# Patient Record
Sex: Female | Born: 1990 | Race: White | Hispanic: No | Marital: Single | Smoking: Never smoker
Health system: Southern US, Community
[De-identification: ages and names within clinical notes are randomized; demographics above are authoritative.]

## PROBLEM LIST (undated history)

## (undated) DIAGNOSIS — N912 Amenorrhea, unspecified: Secondary | ICD-10-CM

## (undated) DIAGNOSIS — N200 Calculus of kidney: Secondary | ICD-10-CM

## (undated) HISTORY — DX: Calculus of kidney: N20.0

## (undated) HISTORY — DX: Amenorrhea, unspecified: N91.2

---

## 2013-01-14 ENCOUNTER — Ambulatory Visit (INDEPENDENT_AMBULATORY_CARE_PROVIDER_SITE_OTHER): Payer: BC Managed Care – PPO | Admitting: Gynecology

## 2013-01-14 ENCOUNTER — Encounter: Payer: Self-pay | Admitting: Gynecology

## 2013-01-14 VITALS — BP 116/70 | Ht 61.0 in | Wt 215.0 lb

## 2013-01-14 DIAGNOSIS — F3281 Premenstrual dysphoric disorder: Secondary | ICD-10-CM

## 2013-01-14 DIAGNOSIS — Z01419 Encounter for gynecological examination (general) (routine) without abnormal findings: Secondary | ICD-10-CM

## 2013-01-14 DIAGNOSIS — N943 Premenstrual tension syndrome: Secondary | ICD-10-CM

## 2013-01-14 DIAGNOSIS — N912 Amenorrhea, unspecified: Secondary | ICD-10-CM | POA: Insufficient documentation

## 2013-01-14 DIAGNOSIS — Z124 Encounter for screening for malignant neoplasm of cervix: Secondary | ICD-10-CM

## 2013-01-14 MED ORDER — FLUOXETINE HCL (PMDD) 10 MG PO CAPS: ORAL_CAPSULE | ORAL | Status: DC

## 2013-01-14 MED ORDER — MEDROXYPROGESTERONE ACETATE 10 MG PO TABS: ORAL_TABLET | ORAL | Status: DC

## 2013-01-14 NOTE — Patient Instructions (Signed)
Secondary Amenorrhea   Secondary amenorrhea is the stopping of menstrual flow for 3 to 6 months in a female who has previously had periods. There are many possible causes. Most of these causes are not serious. Usually treating the underlying problem causing the loss of menses will return your periods to normal.  CAUSES   Some common and uncommon causes of not menstruating include:  · Malnutrition.  · Low blood sugar (hypoglycemia).  · Polycystic ovarian disease.  · Stress or fear.  · Breastfeeding.  · Hormone imbalance.  · Ovarian failure.  · Medications.  · Extreme obesity.  · Cystic fibrosis.  · Low body weight or drastic weight reduction from any cause.  · Early menopause.  · Removal of ovaries or uterus.  · Contraceptives.  · Illness.  · Long term (chronic) illnesses.  · Cushing's syndrome.  · Thyroid problems.  · Birth control pills, patches, or vaginal rings for birth control.  DIAGNOSIS   This diagnosis is made by your caregiver taking a medical history and doing a physical exam. Pregnancy must be ruled out. Often times, numerous blood tests of different hormones in the body may be measured. Urine testing may be done. Specialized x-rays may have to be done as well as measuring the body mass index (BMI).  TREATMENT   Treatment depends on the cause of the amenorrhea. If an eating disorder is present, this can be treated with an adequate diet and therapy. Chronic illnesses may improve with treatment of the illness. Overall, the outlook is good. The amenorrhea may be corrected with medications, lifestyle changes, or surgery. If the amenorrhea cannot be corrected, it is sometimes possible to create a false menstruation with medications.  Document Released: 10/07/2006 Document Revised: 11/18/2011 Document Reviewed: 08/14/2007  [employer]® Patient Information ©2013 [employer].

## 2013-01-14 NOTE — Progress Notes (Signed)
22 y.o.  Single  Caucasian female G0 presents for management of amenorrhea.  Pt states LMP 12/13, not sexually active in 4y, pt was started on oc 2013 for irregular menses and PMDD, was on 6-57m, with good regulation but no affect on PMS, pt is unsure type of oc.  Pt states Dec menses was result of oc.  Before OC, periods were q2-75m, light to medium flow.   Pt states she now gets cramping, PMS but no bleeding.  Pt reports 20# weight gain in last 8m, reports diet and exercise changes, recently started exercise regimen    Patient's last menstrual period was 08/09/2012.          Sexually active: no  The current method of family planning is none.    Exercising: cardio, weights, walking 5x/wk  Last mammogram:  none Last pap smear: History of abnormal pap:  Smoking: no Alcohol: 4-5 drinks/wk Last colonoscopy: none Last Bone Density:  none Last tetanus shot: 4 years ago Last cholesterol check:   Hgb: Gives Blood               Urine: unable    No health maintenance topics applied.  Family History  Problem Relation Age of Onset  . Thyroid disease Mother     There are no active problems to display for this patient.   Past Medical History  Diagnosis Date  . Amenorrhea     No past surgical history on file.  Allergies: Review of patient's allergies indicates no known allergies.  Current Outpatient Prescriptions  Medication Sig Dispense Refill  . Cimetidine (ACID REDUCER PO) Take by mouth.       No current facility-administered medications for this visit.    ROS: Pertinent items are noted in HPI.  Social Hx:    Exam:    BP 116/70  Ht 5\' 1"  (1.549 m)  Wt 215 lb (97.523 kg)  BMI 40.64 kg/m2  LMP 08/09/2012   Wt Readings from Last 3 Encounters:  01/14/13 215 lb (97.523 kg)     Ht Readings from Last 3 Encounters:  01/14/13 5\' 1"  (1.549 m)    General appearance: alert, cooperative and appears stated age Head: Normocephalic, without obvious abnormality, atraumatic Neck:  no adenopathy, supple, symmetrical, trachea midline and thyroid not enlarged, symmetric, no tenderness/mass/nodules Lungs: clear to auscultation bilaterally Breasts: Inspection negative, No nipple retraction or dimpling, No nipple discharge or bleeding, No axillary or supraclavicular adenopathy, Normal to palpation without dominant masses Heart: regular rate and rhythm Abdomen: soft, non-tender; bowel sounds normal; no masses,  no organomegaly Extremities: extremities normal, atraumatic, no cyanosis or edema Skin: Skin color, texture, turgor normal. No rashes or lesions Lymph nodes: Cervical, supraclavicular, and axillary nodes normal. No abnormal inguinal nodes palpated Neurologic: Grossly normal   Pelvic: External genitalia:  no lesions              Urethra:  normal appearing urethra with no masses, tenderness or lesions              Bartholins and Skenes: normal                 Vagina: normal appearing vagina with normal color and discharge, no lesions              Cervix: normal appearance              Pap taken: yes        Bimanual Exam:  Uterus:  uterus is normal size, shape, consistency  and nontender                                      Adnexa: normal adnexa in size, nontender and no masses                                      Rectovaginal: Confirms                                      Anus:  normal sphincter tone, no lesions  A: normal gyn exam amenorrhea     P: partial evaluation in [place], will get record release and add tests as indicated. Pt agreeable Will start cyclic progestin with sarafem to treat amenorrhea and PMDD- re-eval in 44m, pt aware not contraceptive, discused risks of endometrial ca with unopposed estrogen  pap smear with reflex  return annually or prn     An After Visit Summary was printed and given to the patient.

## 2013-01-20 LAB — HM PAP SMEAR: HM Pap smear: NORMAL

## 2013-06-15 ENCOUNTER — Emergency Department (HOSPITAL_COMMUNITY): Payer: BC Managed Care – PPO

## 2013-06-15 ENCOUNTER — Encounter (HOSPITAL_COMMUNITY): Payer: Self-pay | Admitting: Emergency Medicine

## 2013-06-15 ENCOUNTER — Emergency Department (HOSPITAL_COMMUNITY)
Admission: EM | Admit: 2013-06-15 | Discharge: 2013-06-15 | Disposition: A | Discharge status: Home / Self Care | Payer: BC Managed Care – PPO | Attending: Emergency Medicine | Admitting: Emergency Medicine

## 2013-06-15 DIAGNOSIS — R3915 Urgency of urination: Secondary | ICD-10-CM | POA: Insufficient documentation

## 2013-06-15 DIAGNOSIS — E669 Obesity, unspecified: Secondary | ICD-10-CM | POA: Insufficient documentation

## 2013-06-15 DIAGNOSIS — Z3202 Encounter for pregnancy test, result negative: Secondary | ICD-10-CM | POA: Insufficient documentation

## 2013-06-15 DIAGNOSIS — Z79899 Other long term (current) drug therapy: Secondary | ICD-10-CM | POA: Insufficient documentation

## 2013-06-15 DIAGNOSIS — R509 Fever, unspecified: Secondary | ICD-10-CM | POA: Insufficient documentation

## 2013-06-15 DIAGNOSIS — R300 Dysuria: Secondary | ICD-10-CM | POA: Insufficient documentation

## 2013-06-15 DIAGNOSIS — R350 Frequency of micturition: Secondary | ICD-10-CM | POA: Insufficient documentation

## 2013-06-15 DIAGNOSIS — M549 Dorsalgia, unspecified: Secondary | ICD-10-CM | POA: Insufficient documentation

## 2013-06-15 DIAGNOSIS — R109 Unspecified abdominal pain: Secondary | ICD-10-CM

## 2013-06-15 DIAGNOSIS — N23 Unspecified renal colic: Secondary | ICD-10-CM

## 2013-06-15 DIAGNOSIS — Z8742 Personal history of other diseases of the female genital tract: Secondary | ICD-10-CM | POA: Insufficient documentation

## 2013-06-15 DIAGNOSIS — R112 Nausea with vomiting, unspecified: Secondary | ICD-10-CM | POA: Insufficient documentation

## 2013-06-15 LAB — URINALYSIS, ROUTINE W REFLEX MICROSCOPIC
Bilirubin Urine: NEGATIVE
Glucose, UA: NEGATIVE mg/dL
Specific Gravity, Urine: 1.029 (ref 1.005–1.030)
Urobilinogen, UA: 0.2 mg/dL (ref 0.0–1.0)

## 2013-06-15 LAB — URINE MICROSCOPIC-ADD ON

## 2013-06-15 MED ORDER — OXYCODONE-ACETAMINOPHEN 5-325 MG PO TABS: 1.0000 | ORAL_TABLET | Freq: Four times a day (QID) | ORAL | Status: DC | PRN

## 2013-06-15 MED ORDER — ONDANSETRON HCL 4 MG PO TABS: 4.0000 mg | ORAL_TABLET | Freq: Four times a day (QID) | ORAL | Status: DC

## 2013-06-15 MED ORDER — SODIUM CHLORIDE 0.9 % IV BOLUS (SEPSIS)
500.0000 mL | Freq: Once | INTRAVENOUS | Status: AC
  Administered 2013-06-15: 500 mL via INTRAVENOUS

## 2013-06-15 MED ORDER — MORPHINE SULFATE 4 MG/ML IJ SOLN
4.0000 mg | Freq: Once | INTRAMUSCULAR | Status: AC
  Administered 2013-06-15: 4 mg via INTRAVENOUS
  Filled 2013-06-15: qty 1

## 2013-06-15 NOTE — ED Provider Notes (Signed)
CSN: [id number]     Arrival date & time 06/15/13  1506 History   First MD Initiated Contact with Patient 06/15/13 1515     Chief Complaint  Patient presents with  . Flank Pain   (Consider location/radiation/quality/duration/timing/severity/associated sxs/prior Treatment) HPI Comments: 22 year old obese female presents to the emergency department complaining of gradual onset right-sided flank pain beginning when she first woke up from sleep earlier today, worsening around 1:00 PM today. Patient states when she woke up she felt as if she had a bladder infection, had increased urinary frequency, urgency and dysuria. She then went to work and around 1:00 PM she began to have sharp pains in her right flank radiating around her side to her suprapubic region. Pain is constant, rated 10 out of 10 initially, she was given fentanyl via EMS which brought her pain down to a 5/10. Admits to associated nausea and one episode of vomiting when the pain began. States she feels feverish. Since the pain became more severe she has had difficulty urinating. Denies vaginal bleeding, discharge or pain. No history of kidney stones.  Patient is a 22 y.o. female presenting with flank pain. The history is provided by the patient.  Flank Pain Associated symptoms include a fever, nausea and vomiting. Pertinent negatives include no chest pain or chills.    Past Medical History  Diagnosis Date  . Amenorrhea    No past surgical history on file. Family History  Problem Relation Age of Onset  . Thyroid disease Mother    History  Substance Use Topics  . Smoking status: Never Smoker   . Smokeless tobacco: Not on file  . Alcohol Use: 3.0 oz/week    5 Glasses of wine per week   OB History   Grav Para Term Preterm Abortions TAB SAB Ect Mult Living   0              Review of Systems  Constitutional: Positive for fever. Negative for chills.  Respiratory: Negative for shortness of breath.   Cardiovascular: Negative for  chest pain.  Gastrointestinal: Positive for nausea and vomiting.  Genitourinary: Positive for dysuria, urgency, frequency, flank pain, decreased urine volume and difficulty urinating. Negative for hematuria, vaginal bleeding, vaginal discharge, vaginal pain, menstrual problem and pelvic pain.  Musculoskeletal: Positive for back pain.  All other systems reviewed and are negative.    Allergies  Review of patient's allergies indicates no known allergies.  Home Medications   Current Outpatient Rx  Name  Route  Sig  Dispense  Refill  . ranitidine (ZANTAC) 150 MG tablet   Oral   Take 150 mg by mouth daily.          BP 139/89  Pulse 81  Temp(Src) 97.8 F (36.6 C) (Oral)  Resp 16  SpO2 95% Physical Exam  Nursing note and vitals reviewed. Constitutional: She is oriented to person, place, and time. She appears well-developed and well-nourished. No distress.  Obese  HENT:  Head: Normocephalic and atraumatic.  Mouth/Throat: Oropharynx is clear and moist.  Eyes: Conjunctivae are normal.  Neck: Normal range of motion. Neck supple.  Cardiovascular: Normal rate, regular rhythm and normal heart sounds.   Pulmonary/Chest: Effort normal and breath sounds normal.  Abdominal: Soft. Normal appearance and bowel sounds are normal. She exhibits no distension and no mass. There is tenderness ("uncomfortable feeling"). There is CVA tenderness (right). There is no rigidity, no rebound and no guarding.    No peritoneal signs.  Musculoskeletal: Normal range of motion. She  exhibits no edema.  Neurological: She is alert and oriented to person, place, and time.  Skin: Skin is warm and dry. She is not diaphoretic.  Psychiatric: She has a normal mood and affect. Her behavior is normal.    ED Course  Procedures (including critical care time) Labs Review Labs Reviewed  URINALYSIS, ROUTINE W REFLEX MICROSCOPIC - Abnormal; Notable for the following:    APPearance CLOUDY (*)    Hgb urine dipstick  LARGE (*)    Ketones, ur 15 (*)    All other components within normal limits  URINE MICROSCOPIC-ADD ON - Abnormal; Notable for the following:    Squamous Epithelial / LPF FEW (*)    All other components within normal limits  POCT PREGNANCY, URINE   Imaging Review Ct Abdomen Pelvis Wo Contrast  06/15/2013   CLINICAL DATA:  Right flank pain  EXAM: CT ABDOMEN AND PELVIS WITHOUT CONTRAST  TECHNIQUE: Multidetector CT imaging of the abdomen and pelvis was performed following the standard protocol without intravenous contrast.  COMPARISON:  None.  FINDINGS: Lung bases are unremarkable. Sagittal images of the spine are unremarkable.  There is fatty infiltration of the liver. No calcified gallstones are noted within gallbladder. Unenhanced pancreas, spleen and adrenals are unremarkable. Unenhanced kidneys are symmetrical in size. No nephrolithiasis. No hydronephrosis or hydroureter. No calcified ureteral calculi. Few scattered colonic diverticula without evidence of acute diverticulitis.  No pericecal inflammation. Normal appendix is clearly visualize in axial image 61.  Terminal ileum is unremarkable. The uterus and right ovary is unremarkable. There is a large left ovarian cyst occupying posterior cul-de-sac measures 8 by 6 cm. Correlation with GYN exam and follow-up pelvic ultrasound is recommended. The urinary bladder is empty limiting its assessment.  No small bowel obstruction. No ascites or free air.  IMPRESSION: 1. Fatty infiltration of the liver is noted. 2. Few colonic diverticula without evidence for acute diverticulitis. 3. No pericecal inflammation. Normal appendix. 4. There is a left ovarian cyst measures 8 x 6 cm. Correlation with GYN exam and followup pelvic ultrasound is recommended. 5. No nephrolithiasis. No hydronephrosis or hydroureter.   Electronically Signed   By: Natasha Mead M.D.   On: 06/15/2013 16:09    MDM   1. Flank pain   2. Ureteral colic     Patient with flank pain, increased  urinary frequency, urgency and dysuria. She is well appearing and in no apparent distress with normal vital signs. Possible kidney stone and urinary tract infection. UA and CT abdomen pelvis pending. 4:32 PM CT scan negative for any stone, hydronephrosis or hydroureter. Large hemoglobin seen in urine, evidence of a possible recently passed kidney stone explaining patient's symptoms today. I discussed results with patient, she will be discharged home, short course of Percocet given for severe pain. Return precautions discussed. Patient states understanding of plan and is agreeable.  Trevor Mace, PA-C 06/15/13 [phone]

## 2013-06-15 NOTE — ED Notes (Signed)
Patient with right sided flank pain radiating around to abdomen.  Patient vomited when pan first started at 1315, but none now.  Patient reports she has had dysuria this AM.  No prior history of kidney stones.  EMS gave her of Fentanyl and she rates her pain as a 7.

## 2013-06-15 NOTE — ED Provider Notes (Signed)
Medical screening examination/treatment/procedure(s) were performed by non-physician practitioner and as supervising physician I was immediately available for consultation/collaboration.  Dorlene Footman T Dettmann, MD 06/15/13 2330 

## 2013-07-15 ENCOUNTER — Other Ambulatory Visit: Payer: Self-pay

## 2014-07-12 ENCOUNTER — Ambulatory Visit (INDEPENDENT_AMBULATORY_CARE_PROVIDER_SITE_OTHER): Payer: BC Managed Care – PPO | Admitting: Family Medicine

## 2014-07-12 ENCOUNTER — Encounter: Payer: Self-pay | Admitting: Family Medicine

## 2014-07-12 VITALS — BP 106/68 | HR 92 | Ht 61.0 in | Wt 220.0 lb

## 2014-07-12 DIAGNOSIS — N200 Calculus of kidney: Secondary | ICD-10-CM | POA: Diagnosis not present

## 2014-07-12 DIAGNOSIS — K219 Gastro-esophageal reflux disease without esophagitis: Secondary | ICD-10-CM

## 2014-07-12 MED ORDER — PANTOPRAZOLE SODIUM 40 MG PO TBEC: 40.0000 mg | DELAYED_RELEASE_TABLET | Freq: Every day | ORAL | Status: DC

## 2014-07-12 NOTE — Progress Notes (Signed)
CC: Christine Baldwin is a 23 y.o. female is here for Establish Care   Subjective: HPI:  Pleasant 23 year old here to establish care  Reports a history of heartburn that has been present for the past 1 or 2 years. He was managed with as needed ranitidine in the past however over the past weeks she has been using it twice a day and she believes symptoms are worsening. She describes it as a burning acidic sensation that radiates from the epigastric region behind the sternum. It is worse after large meal and lying down. Slightly improved with ranitidine nothing else particularly makes it better.symptoms are moderate in severity and occasionally wake her up at night.  She reports vomiting once over the past few months due to symptoms but no other regurgitation or difficulty swallowing.  History of kidney stones 2 known episodes. One in October and one over the summer. Symptoms were improved with increasing fluid intake and taking oxycodone. She denies any flank pain, blood in urine, nor any urinary complaints currently.  Review of Systems - General ROS: negative for - chills, fever, night sweats, weight gain or weight loss Ophthalmic ROS: negative for - decreased vision Psychological ROS: negative for - anxiety or depression ENT ROS: negative for - hearing change, nasal congestion, tinnitus or allergies Hematological and Lymphatic ROS: negative for - bleeding problems, bruising or swollen lymph nodes Breast ROS: negative Respiratory ROS: no cough, shortness of breath, or wheezing Cardiovascular ROS: no chest pain or dyspnea on exertion Gastrointestinal ROS: no change in bowel habits, or black or bloody stools Genito-Urinary ROS: negative for - genital discharge, genital ulcers, incontinence or abnormal bleeding from genitals Musculoskeletal ROS: negative for - joint pain or muscle pain Neurological ROS: negative for - headaches or memory loss Dermatological ROS: negative for lumps, mole changes,  rash and skin lesion changes  Past Medical History  Diagnosis Date  . Amenorrhea   . Kidney stones     No past surgical history on file. Family History  Problem Relation Age of Onset  . Thyroid disease Mother   . Heart attack      grandmother  . Hyperlipidemia      grandfather   . Hypertension      grandmother     History   Social History  . Marital Status: Single    Spouse Name: N/A    Number of Children: N/A  . Years of Education: N/A   Occupational History  . Not on file.   Social History Main Topics  . Smoking status: Never Smoker   . Smokeless tobacco: Not on file  . Alcohol Use: 3.0 oz/week    5 Glasses of wine per week  . Drug Use: No  . Sexual Activity:    Partners: Female   Other Topics Concern  . Not on file   Social History Narrative     Objective: BP 106/68 mmHg  Pulse 92  Ht 5\' 1"  (1.549 m)  Wt 220 lb (99.791 kg)  BMI 41.59 kg/m2  General: Alert and Oriented, No Acute Distress HEENT: Pupils equal, round, reactive to light. Conjunctivae clear.  External ears unremarkable, canals clear with intact TMs with appropriate landmarks.  Middle ear appears open without effusion. Pink inferior turbinates.  Moist mucous membranes, pharynx without inflammation nor lesions.  Neck supple without palpable lymphadenopathy nor abnormal masses. Lungs: Clear to auscultation bilaterally, no wheezing/ronchi/rales.  Comfortable work of breathing. Good air movement. Cardiac: Regular rate and rhythm. Normal S1/S2.  No murmurs, rubs,  nor gallops.   Abdomen: obese and soft Extremities: No peripheral edema.  Strong peripheral pulses.  Mental Status: No depression, anxiety, nor agitation. Skin: Warm and dry.  Assessment & Plan: Christine Baldwin was seen today for establish care.  Diagnoses and associated orders for this visit:  Gastroesophageal reflux disease without esophagitis - pantoprazole (PROTONIX) 40 MG tablet; Take 1 tablet (40 mg total) by mouth daily.  Kidney  stones    GERD: uncontrolled, begin Protonix, stop ranitidine after 2-3 days of bridging Kidney stones: Currently controlled and asymptomatic, discussed staying well-hydrated and she was provided with a urine strainer should she have any return of symptoms in hopes of catching a stone for stone analysis   Return if symptoms worsen or fail to improve.

## 2014-10-05 ENCOUNTER — Encounter: Payer: Self-pay | Admitting: Family Medicine

## 2014-10-05 ENCOUNTER — Ambulatory Visit (INDEPENDENT_AMBULATORY_CARE_PROVIDER_SITE_OTHER): Payer: BLUE CROSS/BLUE SHIELD | Admitting: Family Medicine

## 2014-10-05 VITALS — BP 118/74 | HR 87 | Wt 225.0 lb

## 2014-10-05 DIAGNOSIS — N200 Calculus of kidney: Secondary | ICD-10-CM | POA: Diagnosis not present

## 2014-10-05 DIAGNOSIS — K219 Gastro-esophageal reflux disease without esophagitis: Secondary | ICD-10-CM

## 2014-10-05 DIAGNOSIS — Z23 Encounter for immunization: Secondary | ICD-10-CM

## 2014-10-05 MED ORDER — PANTOPRAZOLE SODIUM 40 MG PO TBEC: 40.0000 mg | DELAYED_RELEASE_TABLET | Freq: Every day | ORAL | Status: DC

## 2014-10-05 NOTE — Progress Notes (Signed)
CC: Christine Baldwin is a 24 y.o. female is here for Gastrophageal Reflux and Immunizations   Subjective: HPI:  Follow-up GERD: Has been taking Protonix on a daily basis since I saw her last. She tells me that one week after resolution of her epigastric discomfort and burning sensation behind the sternum. No dietary modifications, nothing seems to make the symptoms returned. She denies any side effects. No diarrhea, constipation, abdominal pain, nausea or vomiting.  Follow-up nephrolithiasis. She denies any flank pain hematuria nor genitourinary complaints and says her last period  Review Of Systems Outlined In HPI  Past Medical History  Diagnosis Date  . Amenorrhea   . Kidney stones     No past surgical history on file. Family History  Problem Relation Age of Onset  . Thyroid disease Mother   . Heart attack      grandmother  . Hyperlipidemia      grandfather   . Hypertension      grandmother     History   Social History  . Marital Status: Single    Spouse Name: N/A    Number of Children: N/A  . Years of Education: N/A   Occupational History  . Not on file.   Social History Main Topics  . Smoking status: Never Smoker   . Smokeless tobacco: Not on file  . Alcohol Use: 3.0 oz/week    5 Glasses of wine per week  . Drug Use: No  . Sexual Activity:    Partners: Female   Other Topics Concern  . Not on file   Social History Narrative     Objective: BP 118/74 mmHg  Pulse 87  Wt 225 lb (102.059 kg)  SpO2 98%  Vital signs reviewed. General: Alert and Oriented, No Acute Distress HEENT: Pupils equal, round, reactive to light. Conjunctivae clear.  External ears unremarkable.  Moist mucous membranes. Lungs: Clear and comfortable work of breathing, speaking in full sentences without accessory muscle use. Cardiac: Regular rate and rhythm.  Neuro: CN II-XII grossly intact, gait normal. Extremities: No peripheral edema.  Strong peripheral pulses.  Mental Status: No  depression, anxiety, nor agitation. Logical though process. Skin: Warm and dry.  Assessment & Plan: Christine Baldwin was seen today for gastrophageal reflux and immunizations.  Diagnoses and associated orders for this visit:  Gastroesophageal reflux disease without esophagitis - pantoprazole (PROTONIX) 40 MG tablet; Take 1 tablet (40 mg total) by mouth daily.  Kidney stones  Need for Tdap vaccination - Tdap vaccine greater than or equal to 7yo IM    Gerd: Controlled, discussed that long-term use of proton pump inhibitors does have some minimal risk especially with absorbing certain vitamins and minerals, I recommended now and every 3 months that she take a holiday for at least a week to see if symptoms return and she needs to restart Protonix. One year of refills provided in case she needs it for that long. Kidney stones: Discussed that be willing to provide her with a prescription of Flomax and hydrocodone that she could submit only if any signs of a kidney stone return, she politely declined stating that these come on so abruptly and intensely she would rather go to the emergency room rather than wait at a pharmacy.   Return in about 1 year (around 10/06/2015).

## 2015-05-12 IMAGING — CT CT ABD-PELV W/O CM
1 series · 14 of 21 positions shown, 19 images · non-contrast
Comparison: None.

CLINICAL DATA: Right flank pain

EXAM:
CT ABDOMEN AND PELVIS WITHOUT CONTRAST
TECHNIQUE: Multidetector CT imaging of the abdomen and pelvis was performed
following the standard protocol without intravenous contrast.

[Series 6: lung · axial · 0.80mm/px · z∈[+1397,+1487]mm · 14 of 21 slices shown, 19 images]
[im 2/21  soft-tissue]
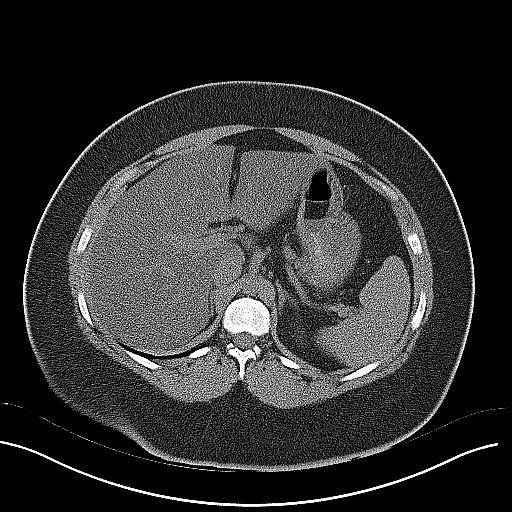
[im 2/21  bone]
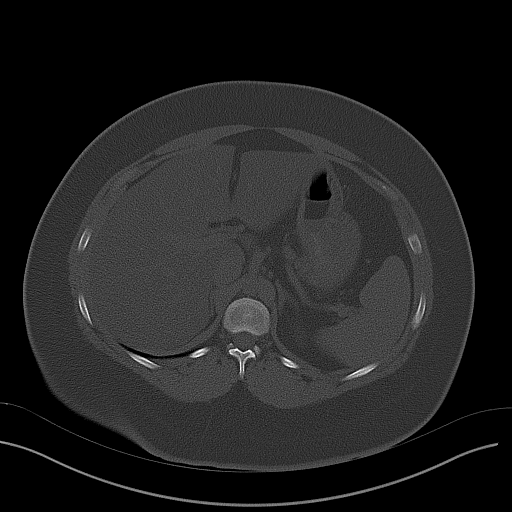
[im 4/21  soft-tissue]
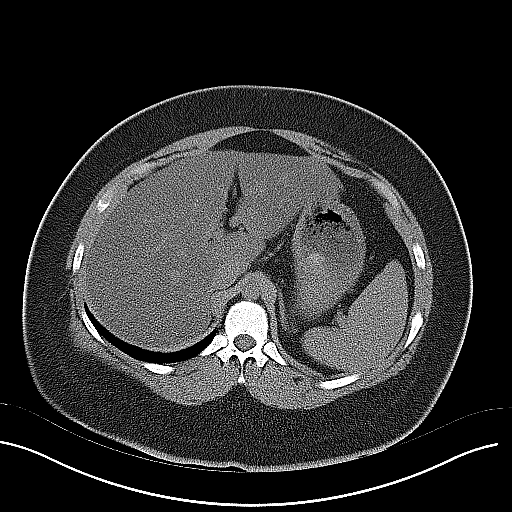
[im 5/21  soft-tissue]
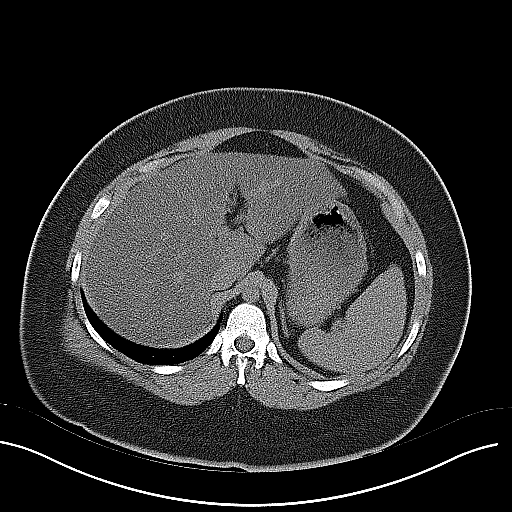
[im 7/21  soft-tissue]
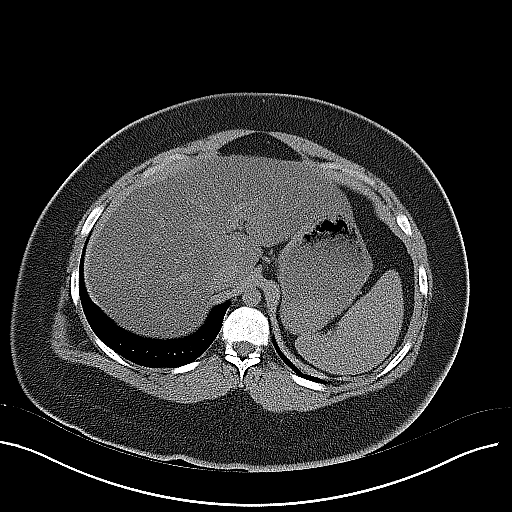
[im 8/21  soft-tissue]
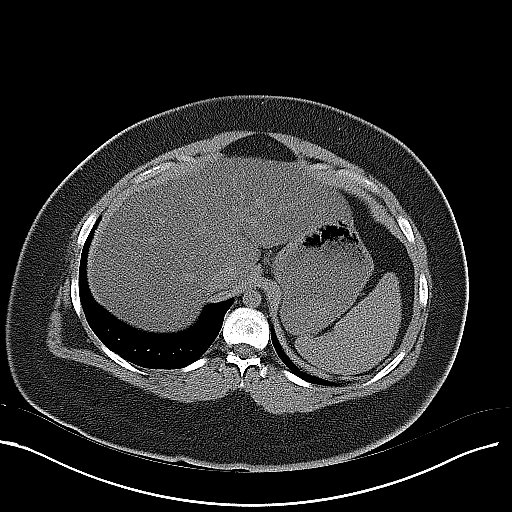
[im 10/21  soft-tissue]
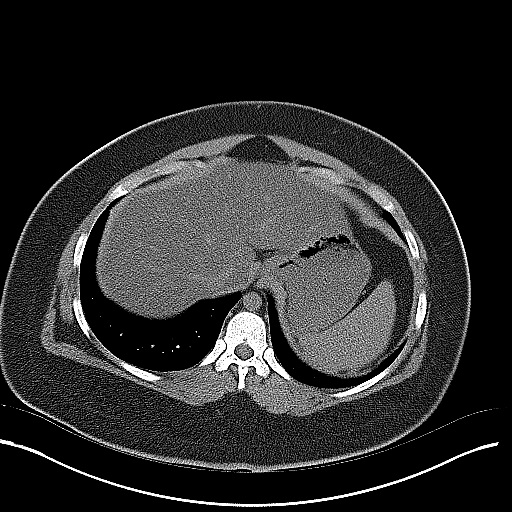
[im 11/21  soft-tissue]
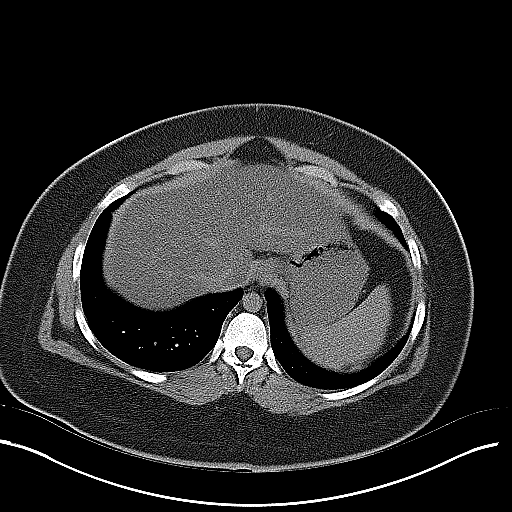
[im 12/21  soft-tissue]
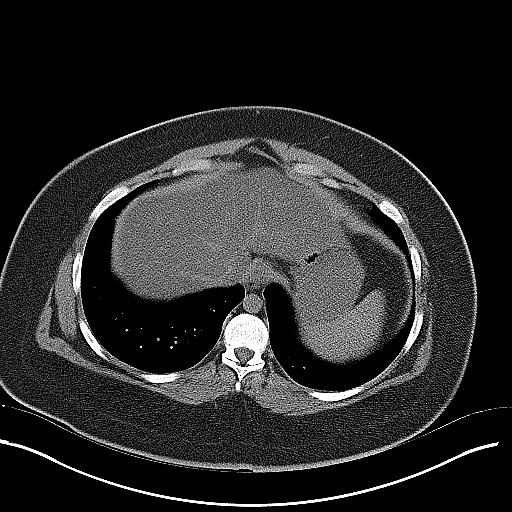
[im 14/21  soft-tissue]
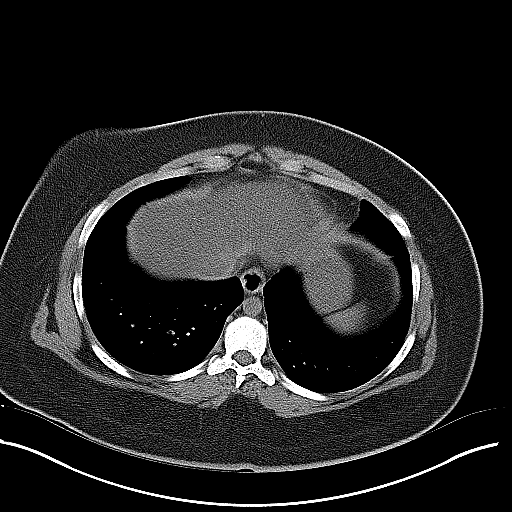
[im 14/21  bone]
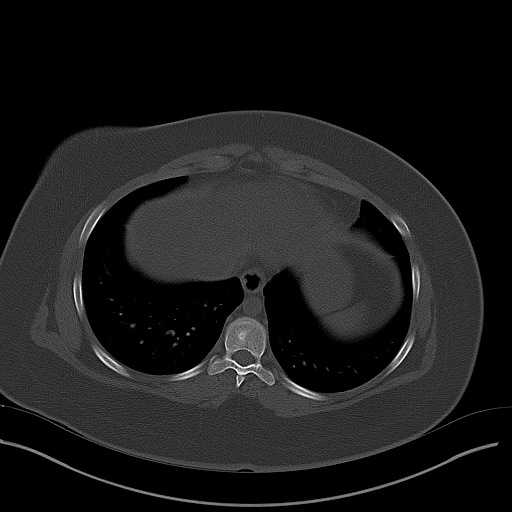
[im 15/21  soft-tissue]
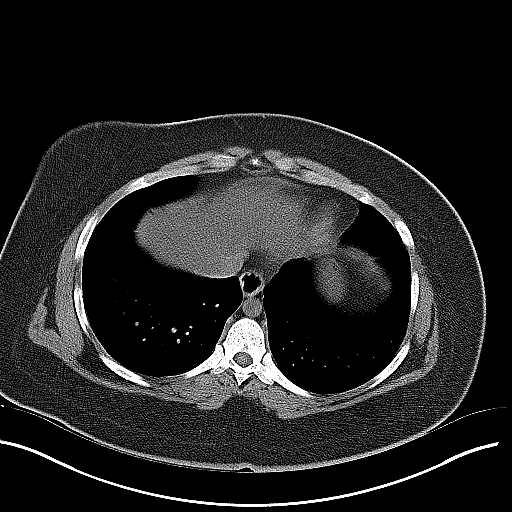
[im 17/21  soft-tissue]
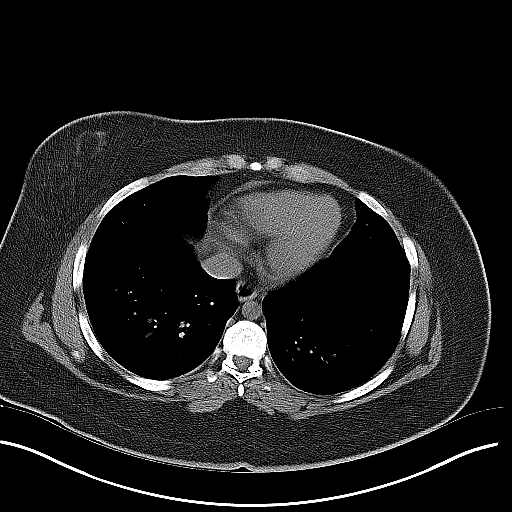
[im 17/21  lung]
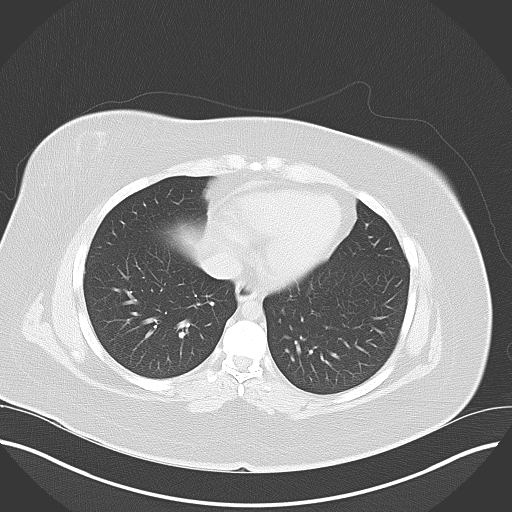
[im 18/21  soft-tissue]
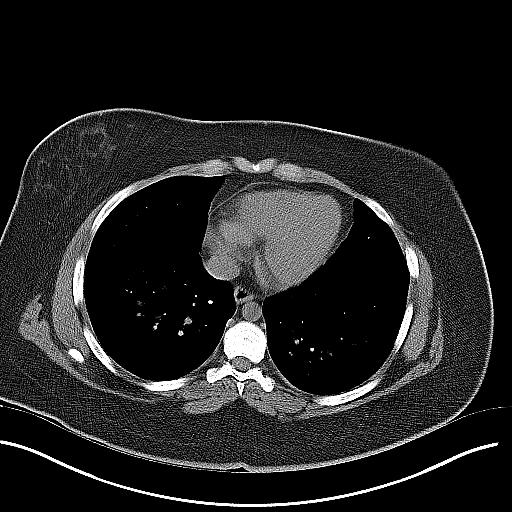
[im 18/21  lung]
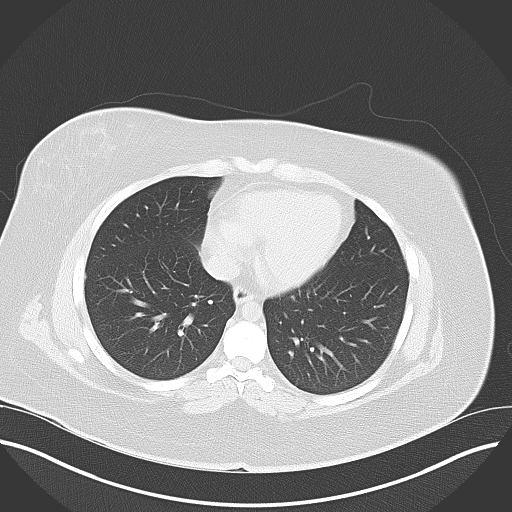
[im 19/21  lung]
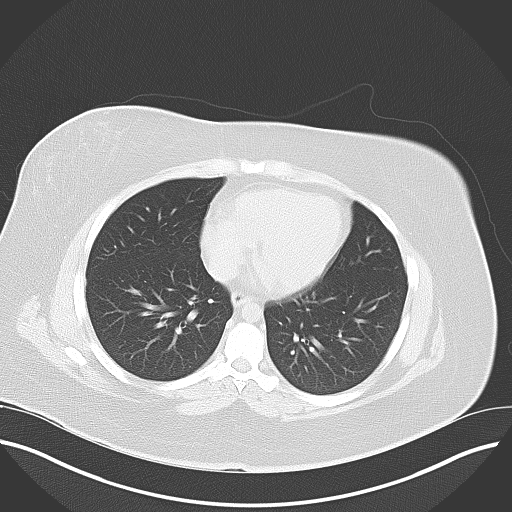
[im 20/21  soft-tissue]
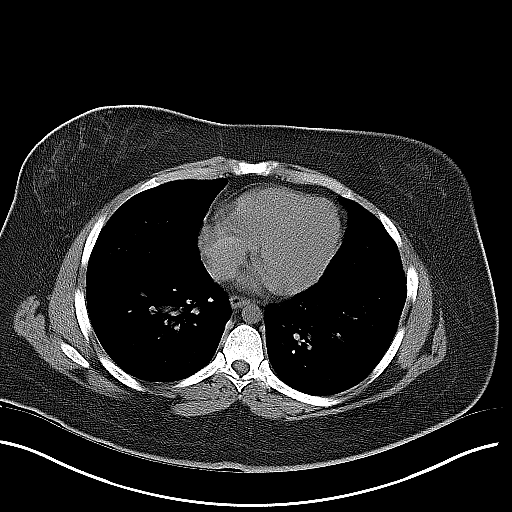
[im 20/21  lung]
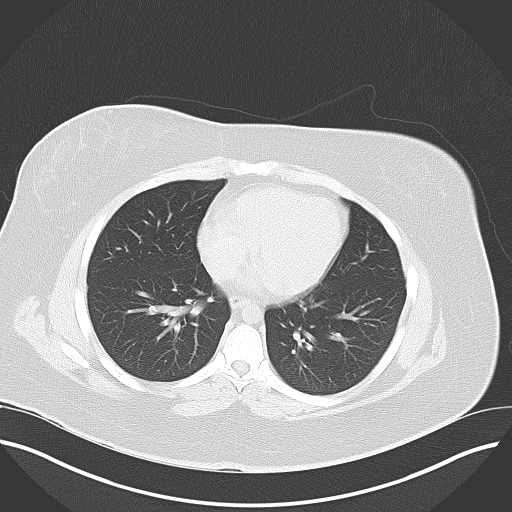

[14 of 21 positions shown; findings below may reference images not displayed]

FINDINGS: Lung bases are unremarkable. Sagittal images of the spine are
unremarkable.

There is fatty infiltration of the liver. No calcified gallstones
are noted within gallbladder. Unenhanced pancreas, spleen and
adrenals are unremarkable. Unenhanced kidneys are symmetrical in
size. No nephrolithiasis. No hydronephrosis or hydroureter. No
calcified ureteral calculi. Few scattered colonic diverticula
without evidence of acute diverticulitis.

No pericecal inflammation. Normal appendix is clearly visualize in
axial image 61.

Terminal ileum is unremarkable. The uterus and right ovary is
unremarkable. There is a large left ovarian cyst occupying posterior
cul-de-sac measures 8 by 6 cm. Correlation with GYN exam and
follow-up pelvic ultrasound is recommended. The urinary bladder is
empty limiting its assessment.

No small bowel obstruction. No ascites or free air.
IMPRESSION: 1. Fatty infiltration of the liver is noted.
2. Few colonic diverticula without evidence for acute
diverticulitis.
3. No pericecal inflammation. Normal appendix.
4. There is a left ovarian cyst measures 8 x 6 cm. Correlation with
GYN exam and followup pelvic ultrasound is recommended.
5. No nephrolithiasis. No hydronephrosis or hydroureter.

## 2015-10-25 ENCOUNTER — Ambulatory Visit (INDEPENDENT_AMBULATORY_CARE_PROVIDER_SITE_OTHER): Payer: BLUE CROSS/BLUE SHIELD | Admitting: Family Medicine

## 2015-10-25 ENCOUNTER — Encounter: Payer: Self-pay | Admitting: Family Medicine

## 2015-10-25 VITALS — BP 117/80 | HR 83 | Wt 237.0 lb

## 2015-10-25 DIAGNOSIS — N200 Calculus of kidney: Secondary | ICD-10-CM | POA: Diagnosis not present

## 2015-10-25 DIAGNOSIS — Z23 Encounter for immunization: Secondary | ICD-10-CM

## 2015-10-25 DIAGNOSIS — K219 Gastro-esophageal reflux disease without esophagitis: Secondary | ICD-10-CM | POA: Diagnosis not present

## 2015-10-25 MED ORDER — TAMSULOSIN HCL 0.4 MG PO CAPS: 0.4000 mg | ORAL_CAPSULE | Freq: Every day | ORAL | Status: DC

## 2015-10-25 MED ORDER — HYDROCODONE-ACETAMINOPHEN 10-325 MG PO TABS: 1.0000 | ORAL_TABLET | Freq: Three times a day (TID) | ORAL | Status: DC | PRN

## 2015-10-25 MED ORDER — PANTOPRAZOLE SODIUM 40 MG PO TBEC: 40.0000 mg | DELAYED_RELEASE_TABLET | Freq: Every day | ORAL | Status: DC

## 2015-10-25 NOTE — Progress Notes (Signed)
CC: Christine Baldwin is a 25 y.o. female is here for Medication Refill   Subjective: HPI:  Follow-up GERD: If she forgets to take a dose of Protonix she'll have a return of her epigastric burning with acidic reflux up the back of her throat. Initially the symptoms go away after a day or 2 of restarting her daily regimen of Protonix with dinner. She denies any other breakthrough symptoms when she is taking it on a daily basis. She denies any abdominal pain, diarrhea, constipation, vomiting, decreased appetite.  She's not had any kidney stones since I saw her last but did have a scare a few months ago where she started to have some mild left-sided flank pain that felt like prior episodes of a kidney stone. She was able to resolve this by drinking a large amount of water over the next 2 days. Last time she was here I propose that she have a prescription of hydrocodone and Flomax on hand should she have an attack at night or over the weekend when her office is closed. She denies any hematuria or dysuria   Review Of Systems Outlined In HPI  Past Medical History  Diagnosis Date  . Amenorrhea   . Kidney stones     No past surgical history on file. Family History  Problem Relation Age of Onset  . Thyroid disease Mother   . Heart attack      grandmother  . Hyperlipidemia      grandfather   . Hypertension      grandmother     Social History   Social History  . Marital Status: Single    Spouse Name: N/A  . Number of Children: N/A  . Years of Education: N/A   Occupational History  . Not on file.   Social History Main Topics  . Smoking status: Never Smoker   . Smokeless tobacco: Not on file  . Alcohol Use: 3.0 oz/week    5 Glasses of wine per week  . Drug Use: No  . Sexual Activity:    Partners: Female   Other Topics Concern  . Not on file   Social History Narrative     Objective: BP 117/80 mmHg  Pulse 83  Wt 237 lb (107.502 kg)  Vital signs reviewed. General: Alert and  Oriented, No Acute Distress HEENT: Pupils equal, round, reactive to light. Conjunctivae clear.  External ears unremarkable.  Moist mucous membranes. Lungs: Clear and comfortable work of breathing, speaking in full sentences without accessory muscle use. Cardiac: Regular rate and rhythm.  Neuro: CN II-XII grossly intact, gait normal. Extremities: No peripheral edema.  Strong peripheral pulses.  Mental Status: No depression, anxiety, nor agitation. Logical though process. Skin: Warm and dry.  Assessment & Plan: Christine Baldwin was seen today for medication refill.  Diagnoses and all orders for this visit:  Gastroesophageal reflux disease without esophagitis -     pantoprazole (PROTONIX) 40 MG tablet; Take 1 tablet (40 mg total) by mouth daily.  Kidney stones  Other orders -     tamsulosin (FLOMAX) 0.4 MG CAPS capsule; Take 1 capsule (0.4 mg total) by mouth daily. If needed for Kidney Stone -     HYDROcodone-acetaminophen (NORCO) 10-325 MG tablet; Take 1 tablet by mouth every 8 (eight) hours as needed (Kidney Stone).   GERD: Controlled with daily Protonix, refills provided Kidney stones: Currently asymptomatic, she would like to take me up on the offer of having Flomax and a small dose of Norco at home should  she have a kidney stone attack when we are closed. I've asked her to call me as soon as possible if she does end up needing these medications for suspicion of a kidney stone so that I can order an ultrasound.  Return in about 1 year (around 10/24/2016).

## 2015-10-26 ENCOUNTER — Other Ambulatory Visit: Payer: Self-pay | Admitting: Family Medicine

## 2016-06-13 ENCOUNTER — Encounter: Payer: Self-pay | Admitting: Sports Medicine

## 2016-06-13 ENCOUNTER — Ambulatory Visit (INDEPENDENT_AMBULATORY_CARE_PROVIDER_SITE_OTHER): Payer: BLUE CROSS/BLUE SHIELD | Admitting: Sports Medicine

## 2016-06-13 VITALS — BP 118/78 | HR 99 | Resp 18 | Wt 232.1 lb

## 2016-06-13 DIAGNOSIS — L237 Allergic contact dermatitis due to plants, except food: Secondary | ICD-10-CM

## 2016-06-13 MED ORDER — METHYLPREDNISOLONE ACETATE 40 MG/ML IJ SUSP
40.0000 mg | Freq: Once | INTRAMUSCULAR | Status: AC
  Administered 2016-06-13: 40 mg via INTRAMUSCULAR

## 2016-06-13 MED ORDER — METHYLPREDNISOLONE SODIUM SUCC 125 MG IJ SOLR
125.0000 mg | Freq: Once | INTRAMUSCULAR | Status: AC
  Administered 2016-06-13: 125 mg via INTRAMUSCULAR

## 2016-06-13 MED ORDER — TRIAMCINOLONE ACETONIDE 0.5 % EX CREA: 1.0000 "application " | TOPICAL_CREAM | Freq: Two times a day (BID) | CUTANEOUS | 3 refills | Status: DC

## 2016-06-13 MED ORDER — PREDNISONE 10 MG (48) PO TBPK: ORAL_TABLET | Freq: Every day | ORAL | 0 refills | Status: DC

## 2016-06-13 NOTE — Addendum Note (Signed)
Addended by: Baird KayUGLAS, Jesseka Drinkard M on: 06/13/2016 04:17 PM   Modules accepted: Orders

## 2016-06-13 NOTE — Assessment & Plan Note (Signed)
Fairly severe and extensive on arms, chest, ears. SoluMedrol 125, Depo-Medrol 40, prednisone taper, topical triamcinolone. She will do over-the-counter Benadryl at bedtime. Return in 2 weeks.

## 2016-06-13 NOTE — Progress Notes (Signed)
  Subjective:    CC: Poison ivy  HPI: Several days ago this pleasant 25 year old female got into some poison ivy, previously healthy, she had a severe reaction with intense swelling, blistering over the arms, chest, abdomen, and behind the ear. Symptoms are severe, persistent, no pulmonary respiratory symptoms.  Past medical history:  Negative.  See flowsheet/record as well for more information.  Surgical history: Negative.  See flowsheet/record as well for more information.  Family history: Negative.  See flowsheet/record as well for more information.  Social history: Negative.  See flowsheet/record as well for more information.  Allergies, and medications have been entered into the medical record, reviewed, and no changes needed.   Review of Systems: No fevers, chills, night sweats, weight loss, chest pain, or shortness of breath.   Objective:    General: Well Developed, well nourished, and in no acute distress.  Neuro: Alert and oriented x3, extra-ocular muscles intact, sensation grossly intact.  HEENT: Normocephalic, atraumatic, pupils equal round reactive to light, neck supple, no masses, no lymphadenopathy, thyroid nonpalpable.  Skin: Warm and dry, Extensive vesicular, linear and erythematous rash on the arms, chest, abdomen, behind ear. Cardiac: Regular rate and rhythm, no murmurs rubs or gallops, no lower extremity edema.  Respiratory: Clear to auscultation bilaterally. Not using accessory muscles, speaking in full sentences.  Impression and Recommendations:    Poison ivy dermatitis Fairly severe and extensive on arms, chest, ears. SoluMedrol 125, Depo-Medrol 40, prednisone taper, topical triamcinolone. She will do over-the-counter Benadryl at bedtime. Return in 2 weeks.

## 2016-06-28 ENCOUNTER — Encounter: Payer: Self-pay | Admitting: Sports Medicine

## 2016-06-28 ENCOUNTER — Ambulatory Visit (INDEPENDENT_AMBULATORY_CARE_PROVIDER_SITE_OTHER): Payer: BLUE CROSS/BLUE SHIELD | Admitting: Sports Medicine

## 2016-06-28 DIAGNOSIS — L237 Allergic contact dermatitis due to plants, except food: Secondary | ICD-10-CM

## 2016-06-28 MED ORDER — PREDNISONE 10 MG (48) PO TBPK: ORAL_TABLET | Freq: Every day | ORAL | 0 refills | Status: DC

## 2016-06-28 MED ORDER — TRIAMCINOLONE ACETONIDE 0.5 % EX CREA: 1.0000 "application " | TOPICAL_CREAM | Freq: Two times a day (BID) | CUTANEOUS | 3 refills | Status: DC

## 2016-06-28 MED ORDER — METHYLPREDNISOLONE ACETATE 40 MG/ML IJ SUSP
40.0000 mg | Freq: Once | INTRAMUSCULAR | Status: AC
  Administered 2016-06-28: 40 mg via INTRAMUSCULAR

## 2016-06-28 MED ORDER — METHYLPREDNISOLONE SODIUM SUCC 125 MG IJ SOLR
125.0000 mg | Freq: Once | INTRAMUSCULAR | Status: AC
  Administered 2016-06-28: 125 mg via INTRAMUSCULAR

## 2016-06-28 NOTE — Assessment & Plan Note (Signed)
Recurrence of symptoms after resolution of the previous episode. Depo-Medrol 40, Solu-Medrol 125, prednisone 12 day taper. Refilling Kenalog cream. Return as needed.

## 2016-06-28 NOTE — Progress Notes (Signed)
  Subjective:    CC: Poison ivy  HPI: 2 weeks ago I treated this pleasant 25 year old female for poison ivy dermatitis, this resolved unfortunately she was exposed again, and has a new rash. Itching is severe, localized on the arms, torso, abdomen, back, thighs.  Past medical history:  Negative.  See flowsheet/record as well for more information.  Surgical history: Negative.  See flowsheet/record as well for more information.  Family history: Negative.  See flowsheet/record as well for more information.  Social history: Negative.  See flowsheet/record as well for more information.  Allergies, and medications have been entered into the medical record, reviewed, and no changes needed.   Review of Systems: No fevers, chills, night sweats, weight loss, chest pain, or shortness of breath.   Objective:    General: Well Developed, well nourished, and in no acute distress.  Neuro: Alert and oriented x3, extra-ocular muscles intact, sensation grossly intact.  HEENT: Normocephalic, atraumatic, pupils equal round reactive to light, neck supple, no masses, no lymphadenopathy, thyroid nonpalpable.  Skin: Warm and dry, Linear papulopustular lesions consistent with poison ivy dermatitis, there are also erythematous plaques on the arms and chest. All minimally excoriated. Cardiac: Regular rate and rhythm, no murmurs rubs or gallops, no lower extremity edema.  Respiratory: Clear to auscultation bilaterally. Not using accessory muscles, speaking in full sentences.  Impression and Recommendations:    Poison ivy dermatitis Recurrence of symptoms after resolution of the previous episode. Depo-Medrol 40, Solu-Medrol 125, prednisone 12 day taper. Refilling Kenalog cream. Return as needed.  I spent 25 minutes with this patient, greater than 50% was face-to-face time counseling regarding the above diagnoses

## 2016-10-09 ENCOUNTER — Other Ambulatory Visit: Payer: Self-pay | Admitting: *Deleted

## 2016-10-09 DIAGNOSIS — K219 Gastro-esophageal reflux disease without esophagitis: Secondary | ICD-10-CM

## 2016-10-09 MED ORDER — PANTOPRAZOLE SODIUM 40 MG PO TBEC
40.0000 mg | DELAYED_RELEASE_TABLET | Freq: Every day | ORAL | 3 refills | Status: DC
Start: 2016-10-09 — End: 2017-11-22

## 2016-10-16 ENCOUNTER — Ambulatory Visit: Payer: BLUE CROSS/BLUE SHIELD | Admitting: Physician Assistant

## 2016-11-05 ENCOUNTER — Ambulatory Visit (INDEPENDENT_AMBULATORY_CARE_PROVIDER_SITE_OTHER): Payer: BLUE CROSS/BLUE SHIELD | Admitting: Physician Assistant

## 2016-11-05 ENCOUNTER — Encounter: Payer: Self-pay | Admitting: Physician Assistant

## 2016-11-05 VITALS — BP 127/74 | HR 98 | Ht 61.0 in | Wt 229.0 lb

## 2016-11-05 DIAGNOSIS — K2100 Gastro-esophageal reflux disease with esophagitis, without bleeding: Secondary | ICD-10-CM

## 2016-11-05 DIAGNOSIS — Z131 Encounter for screening for diabetes mellitus: Secondary | ICD-10-CM | POA: Diagnosis not present

## 2016-11-05 DIAGNOSIS — Z13220 Encounter for screening for lipoid disorders: Secondary | ICD-10-CM | POA: Diagnosis not present

## 2016-11-05 MED ORDER — PHENTERMINE HCL 37.5 MG PO TABS: 37.5000 mg | ORAL_TABLET | Freq: Every day | ORAL | 0 refills | Status: DC

## 2016-11-05 NOTE — Progress Notes (Signed)
   Subjective:    Patient ID: Christine Baldwin, female    DOB: 04-07-1991, 26 y.o.   MRN: [id number]  HPI Pt is a 26 yo female who presents to the clinic to establish care.   .. Active Ambulatory Problems    Diagnosis Date Noted  . Amenorrhea 01/14/2013  . Morbid obesity (HCC) 01/14/2013  . GERD (gastroesophageal reflux disease) 07/12/2014  . Kidney stones 07/12/2014  . Poison ivy dermatitis 06/13/2016   Resolved Ambulatory Problems    Diagnosis Date Noted  . No Resolved Ambulatory Problems   Past Medical History:  Diagnosis Date  . Amenorrhea   . Kidney stones    .Marland Kitchen. Family History  Problem Relation Age of Onset  . Thyroid disease Mother   . Heart attack      grandmother  . Hyperlipidemia      grandfather   . Hypertension      grandmother    .Marland Kitchen. Social History   Social History  . Marital status: Single    Spouse name: N/A  . Number of children: N/A  . Years of education: N/A   Occupational History  . Not on file.   Social History Main Topics  . Smoking status: Never Smoker  . Smokeless tobacco: Never Used  . Alcohol use 3.0 oz/week    5 Glasses of wine per week  . Drug use: No  . Sexual activity: Not Currently    Partners: Female   Other Topics Concern  . Not on file   Social History Narrative  . No narrative on file   GERD- 2 year hx of GERD. Controlled as long as on protonix. Needs refill.    Review of Systems  All other systems reviewed and are negative.      Objective:   Physical Exam  Constitutional: She is oriented to person, place, and time. She appears well-developed and well-nourished.  HENT:  Head: Normocephalic and atraumatic.  Eyes: Conjunctivae are normal.  Neck: Normal range of motion. Neck supple.  Cardiovascular: Normal rate, regular rhythm and normal heart sounds.   Pulmonary/Chest: Effort normal and breath sounds normal. She has no wheezes.  Lymphadenopathy:    She has no cervical adenopathy.  Neurological: She is alert  and oriented to person, place, and time.  Skin: Skin is dry.  Psychiatric: She has a normal mood and affect. Her behavior is normal.          Assessment & Plan:  Marland Kitchen.Marland Kitchen.Diagnoses and all orders for this visit:  Gastroesophageal reflux disease with esophagitis  Morbid obesity (HCC) -     phentermine (ADIPEX-P) 37.5 MG tablet; Take 1 tablet (37.5 mg total) by mouth daily before breakfast. -     TSH  Screening for diabetes mellitus -     COMPLETE METABOLIC PANEL WITH GFR  Screening for lipid disorders -     Lipid panel   Refilled prontonix. Discussed EGD in next 3 years to check for any esophageal cellular changes.   Discussed 1500 calorie diet. Discussed 150 minutes of exercise a week.  Pt would benefit greatly from losing weight.  Phentermine given. Side effects discussed. Follow up in 1 month for weight check.   Labs given. Needs PAP.

## 2016-12-03 DIAGNOSIS — Z131 Encounter for screening for diabetes mellitus: Secondary | ICD-10-CM | POA: Diagnosis not present

## 2016-12-03 DIAGNOSIS — Z13220 Encounter for screening for lipoid disorders: Secondary | ICD-10-CM | POA: Diagnosis not present

## 2016-12-04 ENCOUNTER — Ambulatory Visit: Payer: BLUE CROSS/BLUE SHIELD | Admitting: Physician Assistant

## 2016-12-04 LAB — COMPLETE METABOLIC PANEL WITH GFR
ALT: 29 U/L (ref 6–29)
AST: 23 U/L (ref 10–30)
Albumin: 4 g/dL (ref 3.6–5.1)
Alkaline Phosphatase: 51 U/L (ref 33–115)
BILIRUBIN TOTAL: 0.8 mg/dL (ref 0.2–1.2)
BUN: 13 mg/dL (ref 7–25)
CO2: 28 mmol/L (ref 20–31)
Calcium: 8.8 mg/dL (ref 8.6–10.2)
Chloride: 103 mmol/L (ref 98–110)
Creat: 0.89 mg/dL (ref 0.50–1.10)
GFR, Est African American: >89 mL/min (ref >=60)
GLUCOSE: 81 mg/dL (ref 65–99)
Potassium: 4.2 mmol/L (ref 3.5–5.3)
SODIUM: 137 mmol/L (ref 135–146)
TOTAL PROTEIN: 6.5 g/dL (ref 6.1–8.1)

## 2016-12-04 LAB — LIPID PANEL
Cholesterol: 117 mg/dL (ref <200)
HDL: 33 mg/dL — AB (ref >50)
LDL Cholesterol: 67 mg/dL (ref <100)
Total CHOL/HDL Ratio: 3.5 Ratio (ref <5.0)
Triglycerides: 83 mg/dL (ref <150)
VLDL: 17 mg/dL (ref <30)

## 2016-12-04 LAB — TSH: TSH: 0.89 m[IU]/L

## 2016-12-11 ENCOUNTER — Encounter: Payer: Self-pay | Admitting: Physician Assistant

## 2016-12-11 ENCOUNTER — Ambulatory Visit (INDEPENDENT_AMBULATORY_CARE_PROVIDER_SITE_OTHER): Payer: BLUE CROSS/BLUE SHIELD | Admitting: Physician Assistant

## 2016-12-11 MED ORDER — PHENTERMINE HCL 37.5 MG PO TABS: 37.5000 mg | ORAL_TABLET | Freq: Every day | ORAL | 0 refills | Status: DC

## 2016-12-11 NOTE — Progress Notes (Signed)
   Subjective:    Patient ID: Christine Baldwin, female    DOB: 1990/10/27, 26 y.o.   MRN: [id number]  HPI  Pt is a 26 yo female who presents to the clinic for weight follow up. She was started on phentermine with no side effects. She denies insomnia, anxiety, constipation, palpitations. She has lost 6lbs. She states she "felt it for the first 2 weeks" but then effects wore off. She is still eating less. She still struggles with energy. She is walking some.    Review of Systems  All other systems reviewed and are negative.      Objective:   Physical Exam  Constitutional: She is oriented to person, place, and time. She appears well-developed and well-nourished.  HENT:  Head: Normocephalic and atraumatic.  Cardiovascular: Normal rate, regular rhythm and normal heart sounds.   Pulmonary/Chest: Effort normal and breath sounds normal.  Neurological: She is alert and oriented to person, place, and time.  Psychiatric: She has a normal mood and affect. Her behavior is normal.          Assessment & Plan:  Marland KitchenMarland KitchenValrie was seen today for obesity.  Diagnoses and all orders for this visit:  Morbid obesity (HCC) -     phentermine (ADIPEX-P) 37.5 MG tablet; Take 1 tablet (37.5 mg total) by mouth daily before breakfast.   Refilled for one month. Follow up nurse visit. Discussed increasing exercise. Discussed belviq and other long term options. She would like to stick with phentermine for a little while.  Goal of 1500 calorie diet.

## 2016-12-11 NOTE — Patient Instructions (Signed)
belviq/saxenda long term weight loss.

## 2016-12-30 ENCOUNTER — Ambulatory Visit (INDEPENDENT_AMBULATORY_CARE_PROVIDER_SITE_OTHER): Payer: BLUE CROSS/BLUE SHIELD | Admitting: Physician Assistant

## 2016-12-30 VITALS — BP 129/82 | HR 90 | Temp 98.0°F | Wt 217.0 lb

## 2016-12-30 DIAGNOSIS — J029 Acute pharyngitis, unspecified: Secondary | ICD-10-CM

## 2016-12-30 NOTE — Progress Notes (Signed)
HPI:                                                                Christine Baldwin is a 26 y.o. female who presents to [name] today for sore throat  Patient reports symptoms have nearly resolved. She came in today because she is going to visit her sister and nephew and wanted to make sure she was not contagious.   Sore Throat   This is a new problem. The current episode started in the past 7 days. The problem has been rapidly improving. Neither side of throat is experiencing more pain than the other. There has been no fever. The pain is mild. Associated symptoms include congestion and a hoarse voice. Pertinent negatives include no coughing, ear pain, neck pain, shortness of breath, swollen glands or trouble swallowing. Associated symptoms comments: "felt like golf ball in throat when I swallow". She has tried nothing for the symptoms.     Past Medical History:  Diagnosis Date  . Amenorrhea   . Kidney stones    No past surgical history on file. Social History  Substance Use Topics  . Smoking status: Never Smoker  . Smokeless tobacco: Never Used  . Alcohol use 3.0 oz/week    5 Glasses of wine per week   family history includes Thyroid disease in her mother.  ROS: negative except as noted in the HPI  Medications: Current Outpatient Prescriptions  Medication Sig Dispense Refill  . pantoprazole (PROTONIX) 40 MG tablet Take 1 tablet (40 mg total) by mouth daily. 90 tablet 3  . phentermine (ADIPEX-P) 37.5 MG tablet Take 1 tablet (37.5 mg total) by mouth daily before breakfast. 30 tablet 0   No current facility-administered medications for this visit.    No Known Allergies     Objective:  BP 129/82   Pulse 90   Temp 98 F (36.7 C) (Oral)   Wt 217 lb (98.4 kg)   BMI 41.00 kg/m  Physical Exam  HENT:  Right Ear: Tympanic membrane and ear canal normal.  Left Ear: Tympanic membrane and ear canal normal.  Nose: Nose  normal.  Mouth/Throat: Uvula is midline, oropharynx is clear and moist and mucous membranes are normal. No oropharyngeal exudate or posterior oropharyngeal erythema. No tonsillar exudate.  Neck: Trachea normal and phonation normal. Neck supple. No tracheal tenderness present. Carotid bruit is not present. No thyroid mass and no thyromegaly present.  Cardiovascular: Normal rate, regular rhythm, S1 normal and S2 normal.   No murmur heard. Pulmonary/Chest: Effort normal and breath sounds normal. No stridor. She has no wheezes. She has no rhonchi. She has no rales.     No results found for this or any previous visit (from the past 72 hour(s)). No results found.    Assessment and Plan: 26 y.o. female with   1. Pharyngitis, unspecified etiology - improving - symptomatic management with salt water gargles   Patient education and anticipatory guidance given Patient agrees with treatment plan Follow-up as needed if symptoms worsen or fail to improve  Levonne Hubert PA-C

## 2017-01-07 ENCOUNTER — Ambulatory Visit (INDEPENDENT_AMBULATORY_CARE_PROVIDER_SITE_OTHER): Payer: BLUE CROSS/BLUE SHIELD | Admitting: Physician Assistant

## 2017-01-07 DIAGNOSIS — L237 Allergic contact dermatitis due to plants, except food: Secondary | ICD-10-CM

## 2017-01-07 MED ORDER — PHENTERMINE HCL 37.5 MG PO TABS: 37.5000 mg | ORAL_TABLET | Freq: Every day | ORAL | 0 refills | Status: DC

## 2017-01-07 MED ORDER — TRIAMCINOLONE ACETONIDE 0.5 % EX CREA: 1.0000 "application " | TOPICAL_CREAM | Freq: Two times a day (BID) | CUTANEOUS | 3 refills | Status: AC

## 2017-01-07 NOTE — Progress Notes (Signed)
Patient is here for blood pressure and weight check. Denies any trouble sleeping, palpitations, or any other medication problems. Patient has lost weight. A refill for Phentermine will be sent to patient preferred pharmacy. Patient advised to schedule a four week nurse visit and keep her upcoming appointment with her PCP. Verbalized understanding.   Pt does request refill on topical itch cream, states she uses this for bug bites and it works very well. Pended this Rx for PCP approval.    Agree with plan. Tandy Gaw PA-C

## 2017-02-04 ENCOUNTER — Ambulatory Visit (INDEPENDENT_AMBULATORY_CARE_PROVIDER_SITE_OTHER): Payer: BLUE CROSS/BLUE SHIELD | Admitting: Physician Assistant

## 2017-02-04 MED ORDER — PHENTERMINE HCL 37.5 MG PO TABS: 37.5000 mg | ORAL_TABLET | Freq: Every day | ORAL | 0 refills | Status: DC

## 2017-02-04 NOTE — Progress Notes (Signed)
Pt is here for a blood pressure and weight check.  Denies trouble sleeping, palpitations or medication problems.    Pt has lost 3 lbs.  A refill for phentermine will be faxed to the pharmacy.  Pt advised to schedule a follow up with the nurse in 30 days.  Agree with above plan. Make sure aware goal weight loss is 4lbs a month. Tandy GawJade Breeback PA-c

## 2017-03-04 ENCOUNTER — Ambulatory Visit: Payer: BLUE CROSS/BLUE SHIELD

## 2017-04-01 ENCOUNTER — Ambulatory Visit (INDEPENDENT_AMBULATORY_CARE_PROVIDER_SITE_OTHER): Payer: BLUE CROSS/BLUE SHIELD | Admitting: Osteopathic Medicine

## 2017-04-01 NOTE — Progress Notes (Signed)
Pt is here for BP and weight check. Denies trouble sleeping, palpitations of medication problems. Pt has lost weight. A refill will be faxed to pharmacy . Pt advised to schedule a f/u with provider in 30 days.

## 2017-04-02 MED ORDER — PHENTERMINE HCL 37.5 MG PO TABS: 37.5000 mg | ORAL_TABLET | Freq: Every day | ORAL | 0 refills | Status: DC

## 2017-05-07 ENCOUNTER — Ambulatory Visit: Payer: BLUE CROSS/BLUE SHIELD

## 2017-05-07 ENCOUNTER — Ambulatory Visit: Payer: BLUE CROSS/BLUE SHIELD | Admitting: Physician Assistant

## 2017-05-16 ENCOUNTER — Ambulatory Visit: Payer: BLUE CROSS/BLUE SHIELD

## 2017-05-16 ENCOUNTER — Ambulatory Visit: Payer: BLUE CROSS/BLUE SHIELD | Admitting: Physician Assistant

## 2017-05-21 ENCOUNTER — Ambulatory Visit (INDEPENDENT_AMBULATORY_CARE_PROVIDER_SITE_OTHER): Payer: BLUE CROSS/BLUE SHIELD | Admitting: Physician Assistant

## 2017-05-21 ENCOUNTER — Encounter: Payer: Self-pay | Admitting: Physician Assistant

## 2017-05-21 DIAGNOSIS — E669 Obesity, unspecified: Secondary | ICD-10-CM

## 2017-05-21 DIAGNOSIS — E66812 Obesity, class 2: Secondary | ICD-10-CM

## 2017-05-21 MED ORDER — PHENTERMINE HCL 37.5 MG PO TABS: 37.5000 mg | ORAL_TABLET | Freq: Every day | ORAL | 0 refills | Status: AC

## 2017-05-21 NOTE — Progress Notes (Signed)
   Subjective:    Patient ID: Christine Baldwin, female    DOB: 10-Apr-1991, 26 y.o.   MRN: [id number]  HPI  Pt is a 26 yo female who presents to the clinic for weight follow up. She has lost 40lbs since April and 10lbs since July on phentermine. She is exercising 5-7 days a week at the gym. She feels great. She denies any insomnia, palpitations, headaches, constipation.   .. Active Ambulatory Problems    Diagnosis Date Noted  . Amenorrhea 01/14/2013  . Morbid obesity (HCC) 01/14/2013  . GERD (gastroesophageal reflux disease) 07/12/2014  . Kidney stones 07/12/2014  . Poison ivy dermatitis 06/13/2016   Resolved Ambulatory Problems    Diagnosis Date Noted  . No Resolved Ambulatory Problems   Past Medical History:  Diagnosis Date  . Amenorrhea   . Kidney stones       Review of Systems  All other systems reviewed and are negative.      Objective:   Physical Exam  Constitutional: She is oriented to person, place, and time. She appears well-developed and well-nourished.  HENT:  Head: Normocephalic and atraumatic.  Cardiovascular: Normal rate, regular rhythm and normal heart sounds.   Pulmonary/Chest: Effort normal and breath sounds normal. She has no wheezes.  Neurological: She is alert and oriented to person, place, and time.  Psychiatric: She has a normal mood and affect. Her behavior is normal.          Assessment & Plan:  Marland Kitchen.Marland Kitchen.[name] was seen today for f/u phentermine.  Diagnoses and all orders for this visit:  Morbid obesity (HCC) -     phentermine (ADIPEX-P) 37.5 MG tablet; Take 1 tablet (37.5 mg total) by mouth daily before breakfast.   Refilled for 2 months. Discussed about time to taper off. BMI has decreased to 35. Follow up in 2 months and will consider long term weight loss medication such as saxenda, belviq, contrave, qsymia.  Marland Kitchen..Discussed low carb diet with 1500 calories and 80g of protein. My Fitness Pal could be a [employer] resource.

## 2017-05-21 NOTE — Patient Instructions (Signed)
saxenda-best belviq contrave qsymia

## 2017-06-17 ENCOUNTER — Ambulatory Visit (INDEPENDENT_AMBULATORY_CARE_PROVIDER_SITE_OTHER): Payer: BLUE CROSS/BLUE SHIELD | Admitting: Physician Assistant

## 2017-06-17 ENCOUNTER — Encounter: Payer: Self-pay | Admitting: Physician Assistant

## 2017-06-17 VITALS — BP 115/76 | HR 102 | Ht 61.0 in | Wt 182.0 lb

## 2017-06-17 DIAGNOSIS — Z3009 Encounter for other general counseling and advice on contraception: Secondary | ICD-10-CM | POA: Diagnosis not present

## 2017-06-17 DIAGNOSIS — E669 Obesity, unspecified: Secondary | ICD-10-CM | POA: Diagnosis not present

## 2017-06-17 NOTE — Addendum Note (Signed)
Addended by: Jomarie Longs on: 06/17/2017 11:36 PM   Modules accepted: Level of Service

## 2017-06-17 NOTE — Progress Notes (Addendum)
   Subjective:    Patient ID: Christine Baldwin, female    DOB: [birth year], 26 y.o.   MRN: [id number]  HPI The patient is a pleasant twenty 26 year old female who presents today to discuss birth control options. She would like to discuss IUD options. She has taken OCPs and states that with working night shifts and day shifts, she has a hard time taking her birth control pill at a consistent time. At this time she is not desiring fertility, but she is sexually active.   She is doing well on phentermine. She is down from 189 lbs on 9/12 to182 lbs today. She is down 50 lbs overall. Her BMI today is 34. She continues to exercise regularly and watching her portion sizes and food options.   .. Active Ambulatory Problems    Diagnosis Date Noted  . Amenorrhea 01/14/2013  . GERD (gastroesophageal reflux disease) 07/12/2014  . Kidney stones 07/12/2014  . Obesity (BMI 30.0-34.9) 06/17/2017   Resolved Ambulatory Problems    Diagnosis Date Noted  . Morbid obesity (HCC) 01/14/2013  . Poison ivy dermatitis 06/13/2016   Past Medical History:  Diagnosis Date  . Amenorrhea   . Kidney stones    Review of Systems  All other systems reviewed and are negative.      Objective:   Physical Exam  Constitutional: She is oriented to person, place, and time. She appears well-developed and well-nourished.  HENT:  Head: Normocephalic and atraumatic.  Cardiovascular: Normal rate, regular rhythm and normal heart sounds.   Pulmonary/Chest: Effort normal and breath sounds normal.  Neurological: She is alert and oriented to person, place, and time.  Skin: Skin is warm and dry.  Psychiatric: She has a normal mood and affect. Her behavior is normal.      Assessment & Plan:  Marland KitchenMarland KitchenDiagnoses and all orders for this visit:  Birth control counseling  Obesity (BMI 30.0-34.9)  We discussed several birth control options including OCP's, NuvaRing, and hormone vs non-hormonal IUD's.. She would like to proceed with  Mirena at this time. We discussed the risk and complications including but not limited to pain, perforation, PID, or the IUD coming out. She agreed to these risks.   Urine GC/Chlamydia done today.   Will get on Dr. Mardelle Matte schedule for IUD placement next week.   Encouragement to keep up the good work. She is nearing the end of her phentermine weight loss. Once done discussed expectations for keeping weight off. If she starts to gain weight back encouraged her to come back to discuss long term weight loss options.

## 2017-06-17 NOTE — Patient Instructions (Signed)

## 2017-06-18 LAB — C. TRACHOMATIS/N. GONORRHOEAE RNA
C. trachomatis RNA, TMA: NOT DETECTED
N. GONORRHOEAE RNA, TMA: NOT DETECTED

## 2017-06-23 ENCOUNTER — Telehealth: Payer: Self-pay | Admitting: Physician Assistant

## 2017-06-23 NOTE — Telephone Encounter (Signed)
-----   Message from Jomarie Longs, PA-C sent at 06/18/2017  8:16 AM EDT ----- Call pt: GC/Chlamydia negative. Make sure she is scheduled for mirena IUD on Dr. Lyn Hollingshead schedule.

## 2017-06-23 NOTE — Telephone Encounter (Signed)
Called patient to set up appointment but no answer and vm was full so couldn't leave a message

## 2017-06-25 ENCOUNTER — Telehealth: Payer: Self-pay | Admitting: Osteopathic Medicine

## 2017-06-25 ENCOUNTER — Ambulatory Visit (INDEPENDENT_AMBULATORY_CARE_PROVIDER_SITE_OTHER): Payer: BLUE CROSS/BLUE SHIELD | Admitting: Osteopathic Medicine

## 2017-06-25 ENCOUNTER — Encounter: Payer: Self-pay | Admitting: Osteopathic Medicine

## 2017-06-25 VITALS — BP 127/83 | HR 86 | Ht 61.0 in | Wt 185.0 lb

## 2017-06-25 DIAGNOSIS — Z3009 Encounter for other general counseling and advice on contraception: Secondary | ICD-10-CM | POA: Diagnosis not present

## 2017-06-25 DIAGNOSIS — Z30430 Encounter for insertion of intrauterine contraceptive device: Secondary | ICD-10-CM

## 2017-06-25 LAB — POCT URINE PREGNANCY: Preg Test, Ur: NEGATIVE

## 2017-06-25 MED ORDER — LEVONORGESTREL 19.5 MG IU IUD: 1.0000 | INTRAUTERINE_SYSTEM | Freq: Once | INTRAUTERINE | 0 refills | Status: AC

## 2017-06-25 MED ORDER — IBUPROFEN 800 MG PO TABS: 800.0000 mg | ORAL_TABLET | Freq: Four times a day (QID) | ORAL | 1 refills | Status: AC | PRN

## 2017-06-25 NOTE — Telephone Encounter (Signed)
Ordered

## 2017-06-25 NOTE — Telephone Encounter (Signed)
-----   Message from Sunnie NielsenNatalie Alexander, DO sent at 06/25/2017  9:26 AM EDT ----- Regarding: IUD We owe OB a Kyleena too!

## 2017-06-25 NOTE — Progress Notes (Signed)
HPI: Christine [facility] is a 26 y.o. female who presents to [facility] today 06/25/17 for chief complaint of:  Chief Complaint  Patient presents with  . Contraception   Patient is seen at the request of Christine Gaw PA-C for consultation regarding IUD birth control.   Initially was on the schedule for Mirena placement, however after discussion we are going to do Soldier instead since this device is a bit smaller for a G0P0 patient and the patient does not suffer from heavy menstrual bleeding which would indicate Mirena usage    Past medical, social and family history reviewed: Patient Active Problem List   Diagnosis Date Noted  . Obesity (BMI 30.0-34.9) 06/17/2017  . GERD (gastroesophageal reflux disease) 07/12/2014  . Kidney stones 07/12/2014  . Amenorrhea 01/14/2013   No past surgical history on file. Social History   Social History  . Marital status: Single    Spouse name: N/A  . Number of Baldwin: N/A  . Years of education: N/A   Occupational History  . Not on file.   Social History Main Topics  . Smoking status: Never Smoker  . Smokeless tobacco: Never Used  . Alcohol use 3.0 oz/week    5 Glasses of wine per week  . Drug use: No  . Sexual activity: Not Currently    Partners: Female   Other Topics Concern  . Not on file   Social History Narrative  . No narrative on file   Family History  Problem Relation Age of Onset  . Thyroid disease Mother   . Heart attack Unknown        grandmother  . Hyperlipidemia Unknown        grandfather   . Hypertension Unknown        grandmother          Current Outpatient Prescriptions (Other):  .  pantoprazole (PROTONIX) 40 MG tablet, Take 1 tablet (40 mg total) by mouth daily. .  phentermine (ADIPEX-P) 37.5 MG tablet, Take 1 tablet (37.5 mg total) by mouth daily before breakfast. .  triamcinolone cream (KENALOG) 0.5 %, Apply 1 application topically 2 (two) times daily. To affected  areas. No Known Allergies    Review of Systems: CONSTITUTIONAL:  No  fever, no chills HEAD/EYES/EARS/NOSE/THROAT: No  headache, no vision change, no history migraines CARDIAC: No  chest pain, No  pressure, No palpitations RESPIRATORY: No  cough, No  shortness of breath/wheeze GASTROINTESTINAL: No  nausea, No  vomiting, No  abdominal pain GENITOURINARY: No  incontinence, No  abnormal genital bleeding/discharge SKIN: No  rash/wounds/concerning lesions   Exam:  BP 127/83   Pulse 86   Ht 5\' 1"  (1.549 m)   Wt 185 lb (83.9 kg)   BMI 34.96 kg/m  Constitutional: VS see above. General Appearance: alert, well-developed, well-nourished, NAD Psychiatric: Normal judgment/insight. Normal mood and affect. Oriented x3.  GYN: see procedure note below   Results for orders placed or performed in visit on 06/25/17 (from the past 24 hour(s))  POCT urine pregnancy     Status: None   Collection Time: 06/25/17 11:51 AM  Result Value Ref Range   Preg Test, Ur Negative Negative      ASSESSMENT/PLAN: Patient was counseled on the risks versus benefits of IUD contraception, including excellent contraceptive efficacy but risk of uterine perforation, small chance of ascending infection, irregular bleeding, and others. In light of her preferences, previous contraception experience, other risk factors, Patient opts to proceed with insertion of [place]  IUD. Patient was educated on the process for insertion, including what to expect from vaginal exam and insertion process, reasons that we would abort the procedure such as abnormal anatomy, non-passage of the uterine sound, patient inability to tolerate the procedure, or other. See procedure note below.   Encounter for insertion of intrauterine contraceptive device (IUD) - Plan: ibuprofen (ADVIL,MOTRIN) 800 MG tablet, Levonorgestrel (KYLEENA) 19.5 MG IUD  Birth control counseling - Discussed size difference and indications for heavy menstrual bleeding as  considerations for IUD choice.    IUD PROCEDURE NOTE  PERTINENT RESULTS REVIEWED: PREGNANCY TEST PRIOR TO PROCEDURE: Negative GONORRHEA/CHLAMYDIA SCREEN: Negative  PRIOR TO PROCEDURE: INFORMED CONSENT OBTAINED: yes SEE SCANNED DOCUMENTS ANY PRETREATMENT: no  PHYSICAL EXAM: GYN: No lesions/ulcers to external genitalia, normal urethra, normal vaginal mucosa, physiologic discharge, cervix normal without lesions, uterus not enlarged or tender, adnexa no masses and nontender  DESCRIPTION OF PROCEDURE: Vaginal speculum placed. Cervix and proximal vagina cleaned with Betadine.Tenaculum applied at 12:00 cervical position and gentle traction applied. Uterus sounded to 8 cm. IUD placed without difficulty. IUD threads cut to 2-3cm from cervical os. Tenaculum and speculum removed. Patient felt strings. Patient tolerated procedure well. Sterile technique maintained.   IUD INFORMATION: BRAND: Kyleena  LOT NUMBER: [id number] CARD GIVEN TO PATIENT: yes    Patient Instructions  IUD AFTER-CARE INSTRUCTIONS: READ THOROUGHLY  Your Rutha BouchardKyleena IUD is currently approved to remain in place for 5 years. At that time, if you wish to receive a new IUD, this can be placed when your current one is removed. If you wish to remove your IUD at any time, for any reason, this can be done easily by your doctor.   You should feel for the strings to your IUD routinely. If you cannot locate the strings, it is recommended you alert your doctor of this.   Be aware that in the first few weeks, your new IUD may cause some discomfort/cramping as it settles into place in your uterus. To ease discomfort, you may apply heating pad to abdomen and take Ibuprofen 800mg  by mouth every 6 hours as needed, but avoid using this dose continuously for more than 5 days. Walking helps as well. You can also expect some irregular bleeding but it should not be heavy for more than a few days.   If pain is severe or if severe bleeding occurs, or if  foul-smelling discharge or fever develops, or if you have any other concerns - contact your doctor right away or go to the Emergency Room.  IUD's are a very reliable method of birth control, but no method is 100% effective. If you think you may be pregnant, see your doctor right away.   An IUD will not protect you from sexually transmitted infections such as HIV, gonorrhea, chlamydia, HPV and others.   It is recommended that you see your doctor as directed for routine well-woman care, which includes Pap testing and may include screening for infections.      All questions were answered. Visit summary with updated medication list and pertinent instructions was printed for patient. ER/RTC precautions were reviewed with the patient. Return for IUD insertion / string check 4 weeks as directed.   Total time spent 25 minutes, greater than 50% of the visit was counseling and coordinating care for diagnosis of The primary encounter diagnosis was Encounter for insertion of intrauterine contraceptive device (IUD). A diagnosis of Birth control counseling was also pertinent to this visit.

## 2017-06-25 NOTE — Patient Instructions (Signed)
IUD AFTER-CARE INSTRUCTIONS: READ THOROUGHLY  Your Kyleena IUD is currently approved to remain in place for 5 years. At that time, if you wish to receive a new IUD, this can be placed when your current one is removed. If you wish to remove your IUD at any time, for any reason, this can be done easily by your doctor.   You should feel for the strings to your IUD routinely. If you cannot locate the strings, it is recommended you alert your doctor of this.   Be aware that in the first few weeks, your new IUD may cause some discomfort/cramping as it settles into place in your uterus. To ease discomfort, you may apply heating pad to abdomen and take Ibuprofen 800mg by mouth every 6 hours as needed, but avoid using this dose continuously for more than 5 days. Walking helps as well. You can also expect some irregular bleeding but it should not be heavy for more than a few days.   If pain is severe or if severe bleeding occurs, or if foul-smelling discharge or fever develops, or if you have any other concerns - contact your doctor right away or go to the Emergency Room.  IUD's are a very reliable method of birth control, but no method is 100% effective. If you think you may be pregnant, see your doctor right away.   An IUD will not protect you from sexually transmitted infections such as HIV, gonorrhea, chlamydia, HPV and others.   It is recommended that you see your doctor as directed for routine well-woman care, which includes Pap testing and may include screening for infections.   

## 2017-07-22 ENCOUNTER — Ambulatory Visit: Payer: BLUE CROSS/BLUE SHIELD | Admitting: Osteopathic Medicine

## 2017-07-22 ENCOUNTER — Encounter: Payer: Self-pay | Admitting: Osteopathic Medicine

## 2017-07-22 VITALS — BP 114/77 | HR 93 | Wt 182.0 lb

## 2017-07-22 DIAGNOSIS — Z30431 Encounter for routine checking of intrauterine contraceptive device: Secondary | ICD-10-CM

## 2017-07-22 NOTE — Progress Notes (Signed)
HPI: [facility] is here today for IUD string check  Doing well on Kyleena placed about a month ago except for some persistent spotting, no pain, no unusual discharge, no fever. Sex isn't painful.   Exam/Objective: BP 114/77   Pulse 93   Wt 182 lb (82.6 kg)   BMI 34.39 kg/m  GYN: No lesions/ulcers to external genitalia, normal urethra, normal vaginal mucosa, physiologic discharge with scant blood, cervix normal without lesions, IUD strings visible   A/P: IUD check up - strings visible, advised some persistent bleeding/spotting is uncommon but not a reason to worry, if persists or if pain, let me know    Return if symptoms worsen or fail to improve, otherwise as directed by PCP.

## 2017-08-20 ENCOUNTER — Encounter: Payer: BLUE CROSS/BLUE SHIELD | Admitting: Physician Assistant

## 2017-08-20 DIAGNOSIS — Z0189 Encounter for other specified special examinations: Secondary | ICD-10-CM

## 2017-11-22 ENCOUNTER — Other Ambulatory Visit: Payer: Self-pay | Admitting: Physician Assistant

## 2017-11-22 DIAGNOSIS — K219 Gastro-esophageal reflux disease without esophagitis: Secondary | ICD-10-CM

## 2018-02-23 DIAGNOSIS — R410 Disorientation, unspecified: Secondary | ICD-10-CM | POA: Diagnosis not present

## 2018-02-23 DIAGNOSIS — R112 Nausea with vomiting, unspecified: Secondary | ICD-10-CM | POA: Diagnosis not present

## 2018-02-23 DIAGNOSIS — G4489 Other headache syndrome: Secondary | ICD-10-CM | POA: Diagnosis not present

## 2018-02-23 DIAGNOSIS — R42 Dizziness and giddiness: Secondary | ICD-10-CM | POA: Diagnosis not present

## 2018-02-23 DIAGNOSIS — R51 Headache: Secondary | ICD-10-CM | POA: Diagnosis not present

## 2018-02-23 DIAGNOSIS — I959 Hypotension, unspecified: Secondary | ICD-10-CM | POA: Diagnosis not present

## 2018-02-27 ENCOUNTER — Other Ambulatory Visit: Payer: Self-pay | Admitting: Physician Assistant

## 2018-02-27 DIAGNOSIS — K219 Gastro-esophageal reflux disease without esophagitis: Secondary | ICD-10-CM

## 2018-05-23 ENCOUNTER — Other Ambulatory Visit: Payer: Self-pay | Admitting: Physician Assistant

## 2018-05-23 DIAGNOSIS — K219 Gastro-esophageal reflux disease without esophagitis: Secondary | ICD-10-CM

## 2018-07-15 DIAGNOSIS — K219 Gastro-esophageal reflux disease without esophagitis: Secondary | ICD-10-CM | POA: Diagnosis not present

## 2018-08-17 DIAGNOSIS — R300 Dysuria: Secondary | ICD-10-CM | POA: Diagnosis not present

## 2018-09-14 DIAGNOSIS — N912 Amenorrhea, unspecified: Secondary | ICD-10-CM | POA: Diagnosis not present

## 2018-10-16 DIAGNOSIS — J069 Acute upper respiratory infection, unspecified: Secondary | ICD-10-CM | POA: Diagnosis not present

## 2018-10-20 DIAGNOSIS — J209 Acute bronchitis, unspecified: Secondary | ICD-10-CM | POA: Diagnosis not present

## 2018-11-24 ENCOUNTER — Other Ambulatory Visit: Payer: Self-pay | Admitting: Physician Assistant

## 2018-11-24 DIAGNOSIS — L237 Allergic contact dermatitis due to plants, except food: Secondary | ICD-10-CM

## 2018-12-28 ENCOUNTER — Telehealth: Payer: Self-pay | Admitting: Physician Assistant

## 2018-12-28 NOTE — Telephone Encounter (Signed)
Lesly Rubenstein handed me a paper that this patient was due for PAP since we do not have current one on file. I left a brief VM for patient to call back and let the office know if she has transferred to another office or if she is still seeing Jade. The direct call back number was given.

## 2018-12-30 NOTE — Telephone Encounter (Signed)
Called the patient again and the mailbox is full and I was unable to leave a message for the patient to call back. Can you see if the patient wants to be seen?

## 2018-12-31 NOTE — Telephone Encounter (Signed)
I was unable to reach patient nor her mother both phones was ringing busy. I called her  "other" contact she will text patient to have her to call us.

## 2019-01-01 NOTE — Telephone Encounter (Signed)
[facility] called and left a message stating she is no longer a patient with our office.

## 2019-03-16 DIAGNOSIS — N200 Calculus of kidney: Secondary | ICD-10-CM | POA: Diagnosis not present

## 2019-03-16 DIAGNOSIS — R300 Dysuria: Secondary | ICD-10-CM | POA: Diagnosis not present

## 2019-04-19 DIAGNOSIS — N912 Amenorrhea, unspecified: Secondary | ICD-10-CM | POA: Diagnosis not present

## 2019-04-19 DIAGNOSIS — Z01419 Encounter for gynecological examination (general) (routine) without abnormal findings: Secondary | ICD-10-CM | POA: Diagnosis not present

## 2019-04-19 DIAGNOSIS — Z6835 Body mass index (BMI) 35.0-35.9, adult: Secondary | ICD-10-CM | POA: Diagnosis not present

## 2019-04-19 DIAGNOSIS — Z13220 Encounter for screening for lipoid disorders: Secondary | ICD-10-CM | POA: Diagnosis not present

## 2019-06-09 DIAGNOSIS — M25561 Pain in right knee: Secondary | ICD-10-CM | POA: Diagnosis not present

## 2019-06-15 DIAGNOSIS — M25561 Pain in right knee: Secondary | ICD-10-CM | POA: Diagnosis not present

## 2019-06-15 DIAGNOSIS — R2689 Other abnormalities of gait and mobility: Secondary | ICD-10-CM | POA: Diagnosis not present

## 2019-06-15 DIAGNOSIS — R29898 Other symptoms and signs involving the musculoskeletal system: Secondary | ICD-10-CM | POA: Diagnosis not present

## 2019-06-18 DIAGNOSIS — M958 Other specified acquired deformities of musculoskeletal system: Secondary | ICD-10-CM | POA: Diagnosis not present

## 2019-06-18 DIAGNOSIS — R600 Localized edema: Secondary | ICD-10-CM | POA: Diagnosis not present

## 2019-06-18 DIAGNOSIS — M87051 Idiopathic aseptic necrosis of right femur: Secondary | ICD-10-CM | POA: Diagnosis not present

## 2019-06-18 DIAGNOSIS — M25461 Effusion, right knee: Secondary | ICD-10-CM | POA: Diagnosis not present

## 2019-06-18 DIAGNOSIS — M25561 Pain in right knee: Secondary | ICD-10-CM | POA: Diagnosis not present

## 2019-06-22 DIAGNOSIS — M87051 Idiopathic aseptic necrosis of right femur: Secondary | ICD-10-CM | POA: Diagnosis not present

## 2019-06-23 DIAGNOSIS — M25561 Pain in right knee: Secondary | ICD-10-CM | POA: Diagnosis not present

## 2019-06-23 DIAGNOSIS — R2689 Other abnormalities of gait and mobility: Secondary | ICD-10-CM | POA: Diagnosis not present

## 2019-06-23 DIAGNOSIS — R29898 Other symptoms and signs involving the musculoskeletal system: Secondary | ICD-10-CM | POA: Diagnosis not present

## 2019-07-12 DIAGNOSIS — M25561 Pain in right knee: Secondary | ICD-10-CM | POA: Diagnosis not present

## 2019-07-12 DIAGNOSIS — K219 Gastro-esophageal reflux disease without esophagitis: Secondary | ICD-10-CM | POA: Diagnosis not present

## 2019-07-13 DIAGNOSIS — M879 Osteonecrosis, unspecified: Secondary | ICD-10-CM | POA: Diagnosis not present

## 2019-07-24 DIAGNOSIS — Z20828 Contact with and (suspected) exposure to other viral communicable diseases: Secondary | ICD-10-CM | POA: Diagnosis not present

## 2019-07-28 DIAGNOSIS — M958 Other specified acquired deformities of musculoskeletal system: Secondary | ICD-10-CM | POA: Diagnosis not present

## 2019-07-28 DIAGNOSIS — M25561 Pain in right knee: Secondary | ICD-10-CM | POA: Diagnosis not present
